# Patient Record
Sex: Male | Born: 1980 | Race: White | Hispanic: No | Smoking: Never smoker
Health system: Southern US, Community
[De-identification: ages and names within clinical notes are randomized; demographics above are authoritative.]

---
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
## 2000-02-07 ENCOUNTER — Emergency Department (HOSPITAL_COMMUNITY): Admission: EM | Admit: 2000-02-07 | Discharge: 2000-02-07 | Payer: Self-pay | Admitting: Emergency Medicine

## 2000-02-07 ENCOUNTER — Encounter: Payer: Self-pay | Admitting: Emergency Medicine

## 2000-02-09 ENCOUNTER — Ambulatory Visit (HOSPITAL_BASED_OUTPATIENT_CLINIC_OR_DEPARTMENT_OTHER): Admission: RE | Admit: 2000-02-09 | Discharge: 2000-02-09 | Payer: Self-pay | Admitting: Orthopedic Surgery

## 2000-08-07 ENCOUNTER — Ambulatory Visit (HOSPITAL_BASED_OUTPATIENT_CLINIC_OR_DEPARTMENT_OTHER): Admission: RE | Admit: 2000-08-07 | Discharge: 2000-08-07 | Payer: Self-pay | Admitting: Orthopedic Surgery

## 2001-11-22 ENCOUNTER — Ambulatory Visit (HOSPITAL_COMMUNITY): Admission: RE | Admit: 2001-11-22 | Discharge: 2001-11-22 | Payer: Self-pay | Admitting: Gastroenterology

## 2001-12-31 ENCOUNTER — Ambulatory Visit (HOSPITAL_COMMUNITY): Admission: RE | Admit: 2001-12-31 | Discharge: 2001-12-31 | Payer: Self-pay | Admitting: Gastroenterology

## 2005-04-06 ENCOUNTER — Encounter: Admission: RE | Admit: 2005-04-06 | Discharge: 2005-04-06 | Payer: Self-pay | Admitting: Family Medicine

## 2007-04-26 ENCOUNTER — Inpatient Hospital Stay (HOSPITAL_COMMUNITY): Admission: AC | Admit: 2007-04-26 | Discharge: 2007-05-02 | Payer: Self-pay

## 2007-05-02 ENCOUNTER — Inpatient Hospital Stay (HOSPITAL_COMMUNITY)
Admission: RE | Admit: 2007-05-02 | Discharge: 2007-05-09 | Payer: Self-pay | Admitting: Physical Medicine & Rehabilitation

## 2007-05-02 ENCOUNTER — Ambulatory Visit: Payer: Self-pay | Admitting: Physical Medicine & Rehabilitation

## 2007-05-10 ENCOUNTER — Emergency Department (HOSPITAL_COMMUNITY): Admission: EM | Admit: 2007-05-10 | Discharge: 2007-05-10 | Payer: Self-pay | Admitting: Emergency Medicine
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
## 2011-01-06 NOTE — Procedures (Signed)
[place]. [facility]  Patient:    [name], [name] Visit Number: [id number] MRN: [id number]          Service Type: END Location: ENDO Attending Physician:  Dennison Bulla Ii Dictated by:   Verlin Grills, M.D. Proc. Date: 11/22/01 Admit Date:  11/22/2001 Discharge Date: 11/22/2001                             Procedure Report  PROCEDURE:  Esophagogastroduodenoscopy.  HISTORY:  Mr. [name] is a 30 year old male born 02-15-1981. Mr. [name] has a chronic history of heartburn unassociated with dysphagia or odynophagia.  Mr. [name] was empirically placed on Protonix with excellent result, with gastroesophageal reflux symptom control.  On August 07, 2001, he underwent a double contrast upper GI x-ray series at [facility], which revealed spontaneous gastroesophageal reflux into the distal third of the esophagus and marked gastric mucosal thickening in the gastric body and gastric antrum unassociated with ulcer formation.  MEDICATION ALLERGIES:  None.  CHRONIC MEDICATIONS:  Protonix.  PAST MEDICAL HISTORY:  Acne, fractured hand 1999 and 2001.  FAMILY HISTORY:  A 48 year old father and 58 year old mother, in good health.  ENDOSCOPIST:  Verlin Grills, M.D.  PREMEDICATION:  Versed 10 mg, fentanyl 100 mcg.  ENDOSCOPE:  Olympus gastroscope.  DESCRIPTION OF PROCEDURE:  After obtaining informed consent, Mr. [name] was placed in the left lateral decubitus position.  I administered intravenous Versed and intravenous fentanyl to achieve conscious sedation for the procedure.  The patients blood pressure, oxygen saturation, and cardiac rhythm were monitored throughout the procedure and documented in the medical record.  The Olympus gastroscope was passed through the posterior hypopharynx into the proximal esophagus without difficulty.  I did not visualize the vocal cords.  Esophagoscopy:  The proximal, mid-,  and lower segments of the esophagus appeared normal.  The squamocolumnar junction and esophagogastric junction were noted at approximately 45 cm from the incisor teeth.  Endoscopically there is no evidence for the presence of esophageal mucosal scarring, erosive esophagitis, esophageal ulcers, or Barretts esophagus.  Gastroscopy:  Retroflex view of the gastric cardia and fundus was normal.  The gastric body, antrum, and pylorus appeared completely normal.  There was no indication for the presence of excessive gastric fold thickening.  Duodenoscopy:  The duodenal bulb and descending duodenum appeared normal.  ASSESSMENT:  Normal esophagogastroduodenoscopy. Dictated by:   Verlin Grills, M.D. Attending Physician:  Dennison Bulla Ii DD:  11/22/01 TD:  11/25/01 Job: [phone]

## 2011-01-06 NOTE — Op Note (Signed)
[place]. [facility]  Patient:    [name], [name]                       MRN: [id number] Proc. Date: 02/09/00 Adm. Date:  [id number] Disc. Date: [id number] Attending:  Ronne Binning CC:         Nicki Reaper, M.D. x 2                           Operative Report  PREOPERATIVE DIAGNOSIS:  Fractured fifth metacarpal, right hand.  POSTOPERATIVE DIAGNOSIS:  Fractured fifth metacarpal, right hand.  OPERATION:  Closed rodding of metacarpal, right fifth finger.  SURGEON:  Nicki Reaper, M.D.  ASSISTANT:  R.N.  ANESTHESIA:  Axillary block.  ANESTHESIOLOGIST:  Halford Decamp, M.D.  INDICATIONS:  The patient is an 30 year old male who suffered a fall and fracture of his right fifth metacarpal with transverse mid-shaft and angulated.  DESCRIPTION OF PROCEDURE:  The patient was brought to the operating room where an axillary block was carried out without difficulty.  He was prepped and draped using Betadine scrubbing solution with the right arm free.  A small incision was made after reduction of the fracture and using image intensification.  The IM rod and invasive device was positioned.  A small incision made.  The trocar was then entered into the base of the fifth metacarpal and angulated.  The rod was then advanced distally, crossing the fracture site.  AP and lateral oblique x-rays revealed the device in good position within the medullary canal out to the head of the metacarpal.  The device was then bent, cut beneath the skin, and the skin was closed after irrigation with interrupted 5-0 nylon suture.  A sterile compressive dressing and splint was applied.  The patient tolerated the procedure well and was taken to the recovery room for observation in satisfactory condition.  He is discharged home to return to the [facility] [place] in one week on Vicodin on Keflex. DD:  02/09/00 TD:  02/12/00 Job: [id number]

## 2011-01-06 NOTE — Op Note (Signed)
[place]. [facility]  Patient:    [name], Hunter                       MRN: [id number] Proc. Date: 08/07/00 Adm. Date:  [id number] Attending:  Ronne Binning                           Operative Report  PREOPERATIVE DIAGNOSIS:  Status post fracture with intramedullary rodding, right little finger metacarpal.  POSTOPERATIVE DIAGNOSIS:  Status post fracture with intramedullary rodding, right little finger metacarpal.  OPERATION:  Removal of intramedullary rod.  SURGEON:  Nicki Reaper, M.D.  ASSISTANT:  Joaquin Courts, R.N.  ANESTHESIA:  IV regional.  ANESTHESIOLOGIST:  Cliffton Asters. Ivin Booty, M.D.  HISTORY:  The patient is an 30 year old male who suffered a fracture of his fifth metacarpal.  He underwent closed pinning, IM rodding of this.  DESCRIPTION OF PROCEDURE:  The patient was brought to the operating room, where a forearm-based IV regional anesthetic was carried out without difficulty.  He was prepped and draped using Betadine scrub and solution with the right arm free.  A transverse incision was made over the mass, carried down through subcutaneous tissue.  A cyst was immediately encountered.  This was minimally debrided.  The end of the pin was identified.  This was removed without difficulty.  The wound was copiously irrigated with saline.  The skin was then closed with interrupted 5-0 nylon sutures.  A sterile compressive dressing and splint to the forearm was applied, buddy-taping the ring and little finger.  The patient tolerated the procedure well and was taken to the recovery room for observation in satisfactory condition.  He is discharged home, to return to the [facility] [place] in one week, on Vicodin and Keflex. DD:  08/07/00 TD:  08/07/00 Job: [id number]

## 2011-06-01 LAB — DIFFERENTIAL
Basophils Relative: 0
Eosinophils Absolute: 0.2
Monocytes Absolute: 0.7
Monocytes Relative: 5
Neutrophils Relative %: 77

## 2011-06-01 LAB — I-STAT 8, (EC8 V) (CONVERTED LAB)
BUN: 15
Chloride: 100
pCO2, Ven: 51.5 — ABNORMAL HIGH
pH, Ven: 7.386 — ABNORMAL HIGH

## 2011-06-01 LAB — CBC
Hemoglobin: 10.4 — ABNORMAL LOW
MCHC: 33.4
MCV: 93.4
RBC: 3.33 — ABNORMAL LOW

## 2011-06-02 LAB — BASIC METABOLIC PANEL
Calcium: 7.8 — ABNORMAL LOW
GFR calc Af Amer: >60
GFR calc non Af Amer: >60
Potassium: 4.2
Sodium: 141

## 2011-06-02 LAB — CBC
HCT: 27.3 — ABNORMAL LOW
HCT: 31.4 — ABNORMAL LOW
HCT: 30.6 — ABNORMAL LOW
Hemoglobin: 11 — ABNORMAL LOW
Hemoglobin: 8.8 — ABNORMAL LOW
MCHC: 34.7
MCHC: 34.8
MCHC: 35.4
MCV: 93.3
MCV: 93.5
MCV: 93.8
Platelets: 212
Platelets: 348
Platelets: 444 — ABNORMAL HIGH
RBC: 2.71 — ABNORMAL LOW
RBC: 2.93 — ABNORMAL LOW
RBC: 3.32 — ABNORMAL LOW
RBC: 4.53
RDW: 13
WBC: 11.9 — ABNORMAL HIGH
WBC: 9.2
WBC: 12.8 — ABNORMAL HIGH

## 2011-06-02 LAB — TYPE AND SCREEN
ABO/RH(D): O NEG
Antibody Screen: NEGATIVE

## 2011-06-02 LAB — COMPREHENSIVE METABOLIC PANEL
AST: 52 — ABNORMAL HIGH
Albumin: 2.7 — ABNORMAL LOW
BUN: 10
Chloride: 100
Creatinine, Ser: 0.85
GFR calc Af Amer: >60
Total Bilirubin: 0.8
Total Protein: 6.7

## 2011-06-02 LAB — GENTAMICIN LEVEL, RANDOM: Gentamicin Rm: 1.1

## 2011-06-02 LAB — I-STAT 8, (EC8 V) (CONVERTED LAB)
Chloride: 109
Glucose, Bld: 113 — ABNORMAL HIGH
Hemoglobin: 16.3
Operator id: 277751
TCO2: 24

## 2011-06-02 LAB — DIFFERENTIAL
Eosinophils Relative: 2
Lymphocytes Relative: 10 — ABNORMAL LOW
Lymphs Abs: 1.3
Monocytes Absolute: 1.3 — ABNORMAL HIGH
Monocytes Relative: 10
Neutro Abs: 9.9 — ABNORMAL HIGH

## 2011-06-02 LAB — BLOOD GAS, ARTERIAL
Bicarbonate: 18.6 — ABNORMAL LOW
Patient temperature: 98.6
TCO2: 19.6
pCO2 arterial: 32.1 — ABNORMAL LOW
pH, Arterial: 7.382
pO2, Arterial: 485 — ABNORMAL HIGH

## 2011-06-02 LAB — PROTIME-INR
INR: 1
Prothrombin Time: 13.8

## 2011-06-02 LAB — ABO/RH: ABO/RH(D): O NEG

## 2011-06-02 LAB — POCT I-STAT CREATININE: Creatinine, Ser: 0.9

## 2018-01-01 DIAGNOSIS — R109 Unspecified abdominal pain: Secondary | ICD-10-CM | POA: Diagnosis not present

## 2018-04-05 DIAGNOSIS — H00016 Hordeolum externum left eye, unspecified eyelid: Secondary | ICD-10-CM | POA: Diagnosis not present

## 2018-04-05 DIAGNOSIS — R21 Rash and other nonspecific skin eruption: Secondary | ICD-10-CM | POA: Diagnosis not present

## 2021-08-30 DIAGNOSIS — R0789 Other chest pain: Secondary | ICD-10-CM | POA: Diagnosis not present

## 2021-08-30 DIAGNOSIS — F411 Generalized anxiety disorder: Secondary | ICD-10-CM | POA: Diagnosis not present

## 2021-08-30 DIAGNOSIS — K219 Gastro-esophageal reflux disease without esophagitis: Secondary | ICD-10-CM | POA: Diagnosis not present

## 2021-08-30 DIAGNOSIS — K649 Unspecified hemorrhoids: Secondary | ICD-10-CM | POA: Diagnosis not present

## 2021-09-20 DIAGNOSIS — E663 Overweight: Secondary | ICD-10-CM | POA: Diagnosis not present

## 2021-09-20 DIAGNOSIS — Z13220 Encounter for screening for lipoid disorders: Secondary | ICD-10-CM | POA: Diagnosis not present

## 2021-09-20 DIAGNOSIS — Z0189 Encounter for other specified special examinations: Secondary | ICD-10-CM | POA: Diagnosis not present

## 2021-09-20 DIAGNOSIS — K219 Gastro-esophageal reflux disease without esophagitis: Secondary | ICD-10-CM | POA: Diagnosis not present

## 2022-09-13 ENCOUNTER — Emergency Department (HOSPITAL_BASED_OUTPATIENT_CLINIC_OR_DEPARTMENT_OTHER): Payer: BC Managed Care – PPO | Admitting: Radiology

## 2022-09-13 ENCOUNTER — Encounter (HOSPITAL_BASED_OUTPATIENT_CLINIC_OR_DEPARTMENT_OTHER): Payer: Self-pay

## 2022-09-13 ENCOUNTER — Other Ambulatory Visit: Payer: Self-pay

## 2022-09-13 ENCOUNTER — Emergency Department (HOSPITAL_BASED_OUTPATIENT_CLINIC_OR_DEPARTMENT_OTHER): Payer: BC Managed Care – PPO

## 2022-09-13 ENCOUNTER — Emergency Department (HOSPITAL_BASED_OUTPATIENT_CLINIC_OR_DEPARTMENT_OTHER)
Admission: EM | Admit: 2022-09-13 | Discharge: 2022-09-13 | Disposition: A | Discharge status: Home / Self Care | Payer: BC Managed Care – PPO | Attending: Emergency Medicine | Admitting: Emergency Medicine

## 2022-09-13 DIAGNOSIS — S60512A Abrasion of left hand, initial encounter: Secondary | ICD-10-CM | POA: Insufficient documentation

## 2022-09-13 DIAGNOSIS — R0781 Pleurodynia: Secondary | ICD-10-CM | POA: Insufficient documentation

## 2022-09-13 DIAGNOSIS — R0789 Other chest pain: Secondary | ICD-10-CM | POA: Diagnosis not present

## 2022-09-13 DIAGNOSIS — M79642 Pain in left hand: Secondary | ICD-10-CM | POA: Diagnosis not present

## 2022-09-13 DIAGNOSIS — M25561 Pain in right knee: Secondary | ICD-10-CM | POA: Insufficient documentation

## 2022-09-13 DIAGNOSIS — M25562 Pain in left knee: Secondary | ICD-10-CM | POA: Diagnosis not present

## 2022-09-13 DIAGNOSIS — S301XXA Contusion of abdominal wall, initial encounter: Secondary | ICD-10-CM | POA: Diagnosis not present

## 2022-09-13 DIAGNOSIS — S3991XA Unspecified injury of abdomen, initial encounter: Secondary | ICD-10-CM | POA: Diagnosis not present

## 2022-09-13 DIAGNOSIS — Y92410 Unspecified street and highway as the place of occurrence of the external cause: Secondary | ICD-10-CM | POA: Insufficient documentation

## 2022-09-13 DIAGNOSIS — T1490XA Injury, unspecified, initial encounter: Secondary | ICD-10-CM | POA: Diagnosis not present

## 2022-09-13 LAB — COMPREHENSIVE METABOLIC PANEL
ALT: 43 U/L (ref 0–44)
AST: 23 U/L (ref 15–41)
Albumin: 4.8 g/dL (ref 3.5–5.0)
Alkaline Phosphatase: 64 U/L (ref 38–126)
Anion gap: 9 (ref 5–15)
BUN: 16 mg/dL (ref 6–20)
CO2: 26 mmol/L (ref 22–32)
Calcium: 9.4 mg/dL (ref 8.9–10.3)
Chloride: 104 mmol/L (ref 98–111)
Creatinine, Ser: 0.92 mg/dL (ref 0.61–1.24)
GFR, Estimated: >60 mL/min (ref >60)
Glucose, Bld: 113 mg/dL — ABNORMAL HIGH (ref 70–99)
Potassium: 4.3 mmol/L (ref 3.5–5.1)
Sodium: 139 mmol/L (ref 135–145)
Total Bilirubin: 0.5 mg/dL (ref 0.3–1.2)
Total Protein: 7.8 g/dL (ref 6.5–8.1)

## 2022-09-13 LAB — CBC
HCT: 46.5 % (ref 39.0–52.0)
Hemoglobin: 15.7 g/dL (ref 13.0–17.0)
MCH: 32 pg (ref 26.0–34.0)
MCHC: 33.8 g/dL (ref 30.0–36.0)
MCV: 94.9 fL (ref 80.0–100.0)
Platelets: 238 10*3/uL (ref 150–400)
RBC: 4.9 MIL/uL (ref 4.22–5.81)
RDW: 12.5 % (ref 11.5–15.5)
WBC: 12.3 10*3/uL — ABNORMAL HIGH (ref 4.0–10.5)
nRBC: 0 % (ref 0.0–0.2)

## 2022-09-13 MED ORDER — IOHEXOL 300 MG/ML  SOLN
100.0000 mL | Freq: Once | INTRAMUSCULAR | Status: AC | PRN
  Administered 2022-09-13: 80 mL via INTRAVENOUS
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
## 2022-09-13 NOTE — ED Notes (Signed)
Discharge paperwork given and verbally understood. 
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
## 2022-10-05 DIAGNOSIS — A6000 Herpesviral infection of urogenital system, unspecified: Secondary | ICD-10-CM | POA: Diagnosis not present

## 2022-10-05 DIAGNOSIS — Z0000 Encounter for general adult medical examination without abnormal findings: Secondary | ICD-10-CM | POA: Diagnosis not present

## 2022-10-05 DIAGNOSIS — Z13220 Encounter for screening for lipoid disorders: Secondary | ICD-10-CM | POA: Diagnosis not present

## 2022-10-05 DIAGNOSIS — F411 Generalized anxiety disorder: Secondary | ICD-10-CM | POA: Diagnosis not present

## 2022-10-05 DIAGNOSIS — Z1159 Encounter for screening for other viral diseases: Secondary | ICD-10-CM | POA: Diagnosis not present

## 2022-10-05 DIAGNOSIS — K219 Gastro-esophageal reflux disease without esophagitis: Secondary | ICD-10-CM | POA: Diagnosis not present

## 2022-10-05 DIAGNOSIS — R7401 Elevation of levels of liver transaminase levels: Secondary | ICD-10-CM | POA: Diagnosis not present

## 2022-10-05 DIAGNOSIS — R7303 Prediabetes: Secondary | ICD-10-CM | POA: Diagnosis not present

## 2022-10-05 DIAGNOSIS — Z23 Encounter for immunization: Secondary | ICD-10-CM | POA: Diagnosis not present

## 2023-10-02 DIAGNOSIS — R7401 Elevation of levels of liver transaminase levels: Secondary | ICD-10-CM | POA: Diagnosis not present

## 2023-10-02 DIAGNOSIS — R7303 Prediabetes: Secondary | ICD-10-CM | POA: Diagnosis not present

## 2023-10-02 DIAGNOSIS — K219 Gastro-esophageal reflux disease without esophagitis: Secondary | ICD-10-CM | POA: Diagnosis not present

## 2023-10-02 DIAGNOSIS — Z13220 Encounter for screening for lipoid disorders: Secondary | ICD-10-CM | POA: Diagnosis not present

## 2023-10-08 DIAGNOSIS — Z0000 Encounter for general adult medical examination without abnormal findings: Secondary | ICD-10-CM | POA: Diagnosis not present
